# Patient Record
Sex: Female | Born: 1965 | Race: Black or African American | Hispanic: No | Marital: Married | Smoking: Never smoker
Health system: Southern US, Community
[De-identification: ages and names within clinical notes are randomized; demographics above are authoritative.]

## PROBLEM LIST (undated history)

## (undated) DIAGNOSIS — I10 Essential (primary) hypertension: Secondary | ICD-10-CM

## (undated) DIAGNOSIS — T7840XA Allergy, unspecified, initial encounter: Secondary | ICD-10-CM

## (undated) HISTORY — DX: Allergy, unspecified, initial encounter: T78.40XA

## (undated) HISTORY — DX: Essential (primary) hypertension: I10

---

## 1998-08-20 ENCOUNTER — Other Ambulatory Visit: Admission: RE | Admit: 1998-08-20 | Discharge: 1998-08-20 | Payer: Self-pay | Admitting: *Deleted

## 1999-06-27 ENCOUNTER — Encounter: Payer: Self-pay | Admitting: Obstetrics and Gynecology

## 1999-06-27 ENCOUNTER — Encounter: Admission: RE | Admit: 1999-06-27 | Discharge: 1999-06-27 | Payer: Self-pay | Admitting: Obstetrics and Gynecology

## 1999-09-21 ENCOUNTER — Other Ambulatory Visit: Admission: RE | Admit: 1999-09-21 | Discharge: 1999-09-21 | Payer: Self-pay | Admitting: Obstetrics and Gynecology

## 1999-11-07 ENCOUNTER — Encounter (INDEPENDENT_AMBULATORY_CARE_PROVIDER_SITE_OTHER): Payer: Self-pay

## 1999-11-07 ENCOUNTER — Other Ambulatory Visit: Admission: RE | Admit: 1999-11-07 | Discharge: 1999-11-07 | Payer: Self-pay | Admitting: Obstetrics and Gynecology

## 2000-04-02 ENCOUNTER — Other Ambulatory Visit: Admission: RE | Admit: 2000-04-02 | Discharge: 2000-04-02 | Payer: Self-pay | Admitting: Obstetrics and Gynecology

## 2000-07-25 ENCOUNTER — Other Ambulatory Visit: Admission: RE | Admit: 2000-07-25 | Discharge: 2000-07-25 | Payer: Self-pay | Admitting: Obstetrics and Gynecology

## 2000-11-27 ENCOUNTER — Other Ambulatory Visit: Admission: RE | Admit: 2000-11-27 | Discharge: 2000-11-27 | Payer: Self-pay | Admitting: Obstetrics and Gynecology

## 2001-11-29 ENCOUNTER — Other Ambulatory Visit: Admission: RE | Admit: 2001-11-29 | Discharge: 2001-11-29 | Payer: Self-pay | Admitting: Obstetrics and Gynecology

## 2002-07-24 ENCOUNTER — Encounter: Admission: RE | Admit: 2002-07-24 | Discharge: 2002-10-22 | Payer: Self-pay | Admitting: Obstetrics and Gynecology

## 2002-11-25 ENCOUNTER — Encounter: Admission: RE | Admit: 2002-11-25 | Discharge: 2003-02-23 | Payer: Self-pay | Admitting: Obstetrics and Gynecology

## 2002-12-25 ENCOUNTER — Other Ambulatory Visit: Admission: RE | Admit: 2002-12-25 | Discharge: 2002-12-25 | Payer: Self-pay | Admitting: Obstetrics and Gynecology

## 2004-04-22 ENCOUNTER — Encounter: Admission: RE | Admit: 2004-04-22 | Discharge: 2004-04-22 | Payer: Self-pay | Admitting: Obstetrics and Gynecology

## 2005-04-26 ENCOUNTER — Encounter: Admission: RE | Admit: 2005-04-26 | Discharge: 2005-04-26 | Payer: Self-pay | Admitting: Obstetrics and Gynecology

## 2006-04-27 ENCOUNTER — Encounter: Admission: RE | Admit: 2006-04-27 | Discharge: 2006-04-27 | Payer: Self-pay | Admitting: Obstetrics and Gynecology

## 2007-04-30 ENCOUNTER — Encounter: Admission: RE | Admit: 2007-04-30 | Discharge: 2007-04-30 | Payer: Self-pay | Admitting: Obstetrics and Gynecology

## 2007-10-03 ENCOUNTER — Ambulatory Visit (HOSPITAL_COMMUNITY): Admission: RE | Admit: 2007-10-03 | Discharge: 2007-10-03 | Payer: Self-pay | Admitting: Obstetrics and Gynecology

## 2008-05-06 ENCOUNTER — Encounter: Admission: RE | Admit: 2008-05-06 | Discharge: 2008-05-06 | Payer: Self-pay | Admitting: Obstetrics and Gynecology

## 2009-01-26 ENCOUNTER — Encounter: Admission: RE | Admit: 2009-01-26 | Discharge: 2009-01-26 | Payer: Self-pay | Admitting: Family Medicine

## 2009-05-07 ENCOUNTER — Encounter: Admission: RE | Admit: 2009-05-07 | Discharge: 2009-05-07 | Payer: Self-pay | Admitting: Obstetrics and Gynecology

## 2010-05-16 ENCOUNTER — Encounter
Admission: RE | Admit: 2010-05-16 | Discharge: 2010-05-16 | Payer: Self-pay | Source: Home / Self Care | Attending: Obstetrics and Gynecology | Admitting: Obstetrics and Gynecology

## 2010-09-06 NOTE — Op Note (Signed)
NAME:  Sierra Hudson, Sierra Hudson                ACCOUNT NO.:  [id number]   MEDICAL RECORD NO.:  [id number]          PATIENT TYPE:  AMB   LOCATION:  [facility]                           FACILITY:  [facility]   PHYSICIAN:  Maxie Better, M.D.DATE OF BIRTH:  Jan 24, 1966   DATE OF PROCEDURE:  DATE OF DISCHARGE:                               OPERATIVE REPORT   PREOPERATIVE DIAGNOSIS:  Menorrhagia.   POSTOPERATIVE DIAGNOSIS:  Menorrhagia.   PROCEDURE:  NovaSure ablation with  diagnostic hysteroscopy.   ANESTHESIA:  General paracervical block.   SURGEON:  Maxie Better, MD   PROCEDURE:  Under adequate general anesthesia, the patient was placed in  the dorsal lithotomy position.  She was sterilely prepped and draped in  the usual fashion.  Bladder was catheterized for moderate amount of  urine.  Examination under anesthesia revealed an anteverted, slightly  large uterus.  No adnexal masses could be appreciated.  Bivalved  speculum placed in the vagina.  Single-tooth tenaculum placed on the  anterior lip of the cervix.  A 10 mL of 1% Nesacaine was injected  paracervically at 3 and the 9 o'clock positions.  The cervix easily  accepted a #23 Pratt dilator.  A diagnostic hysteroscope was introduced  into the uterine cavity.  Both tubal ostia could be seen, but they were  sclerosed.  No endometrial masses were noted and no cervical mass was  noted.  The hysteroscope was removed.  The uterus was sounded to 10 cm.  The endocervical canal sounded to 5 cm.  A 5-cm depth was therefore  noted for cavity length.  The cavity width after the insertion of the  NovaSure apparatus was 3.4 and reconfirmed at 3.4 with a power of 94.  Two minutes of ablation was performed, and subsequently the NovaSure  apparatus was removed.  Diagnostic hysteroscope was reinserted.  Good  ablation was then noted throughout.  The procedure was then felt to be  complete, at which time all instruments were then removed from the  vagina.   Specimen was none.  Estimated blood loss was minimal.  Complication was none.  The patient tolerated the procedure well and was  transferred to recovery room in stable condition.      Maxie Better, M.D.  Electronically Signed     [place]/[facility]  D:  10/03/2007  T:  10/04/2007  Job:  [id number]

## 2011-01-19 LAB — CBC
HCT: 31.9 — ABNORMAL LOW
Hemoglobin: 10.8 — ABNORMAL LOW
MCV: 87.8
Platelets: 361
RDW: 16.1 — ABNORMAL HIGH

## 2011-02-20 ENCOUNTER — Other Ambulatory Visit: Payer: Self-pay | Admitting: Obstetrics and Gynecology

## 2011-02-20 DIAGNOSIS — Z1231 Encounter for screening mammogram for malignant neoplasm of breast: Secondary | ICD-10-CM

## 2011-05-19 ENCOUNTER — Ambulatory Visit
Admission: RE | Admit: 2011-05-19 | Discharge: 2011-05-19 | Disposition: A | Discharge status: Home / Self Care | Payer: 59 | Source: Ambulatory Visit | Attending: Obstetrics and Gynecology | Admitting: Obstetrics and Gynecology

## 2011-05-19 DIAGNOSIS — Z1231 Encounter for screening mammogram for malignant neoplasm of breast: Secondary | ICD-10-CM

## 2012-02-19 ENCOUNTER — Other Ambulatory Visit: Payer: Self-pay | Admitting: Obstetrics and Gynecology

## 2012-02-19 DIAGNOSIS — Z1231 Encounter for screening mammogram for malignant neoplasm of breast: Secondary | ICD-10-CM

## 2012-05-20 ENCOUNTER — Ambulatory Visit
Admission: RE | Admit: 2012-05-20 | Discharge: 2012-05-20 | Disposition: A | Discharge status: Home / Self Care | Payer: 59 | Source: Ambulatory Visit | Attending: Obstetrics and Gynecology | Admitting: Obstetrics and Gynecology

## 2012-05-20 DIAGNOSIS — Z1231 Encounter for screening mammogram for malignant neoplasm of breast: Secondary | ICD-10-CM

## 2013-03-17 ENCOUNTER — Other Ambulatory Visit: Payer: Self-pay

## 2013-03-17 DIAGNOSIS — Z1231 Encounter for screening mammogram for malignant neoplasm of breast: Secondary | ICD-10-CM

## 2013-05-22 ENCOUNTER — Other Ambulatory Visit: Payer: Self-pay

## 2013-05-22 ENCOUNTER — Ambulatory Visit
Admission: RE | Admit: 2013-05-22 | Discharge: 2013-05-22 | Disposition: A | Discharge status: Home / Self Care | Payer: Self-pay | Source: Ambulatory Visit

## 2013-05-22 DIAGNOSIS — Z1231 Encounter for screening mammogram for malignant neoplasm of breast: Secondary | ICD-10-CM

## 2013-06-17 ENCOUNTER — Ambulatory Visit (INDEPENDENT_AMBULATORY_CARE_PROVIDER_SITE_OTHER): Payer: 59 | Admitting: Emergency Medicine

## 2013-06-17 VITALS — BP 142/82 | HR 94 | Temp 98.2°F | Resp 18 | Ht 61.5 in | Wt 145.0 lb

## 2013-06-17 DIAGNOSIS — R42 Dizziness and giddiness: Secondary | ICD-10-CM

## 2013-06-17 DIAGNOSIS — R0981 Nasal congestion: Secondary | ICD-10-CM

## 2013-06-17 DIAGNOSIS — J3489 Other specified disorders of nose and nasal sinuses: Secondary | ICD-10-CM

## 2013-06-17 MED ORDER — MECLIZINE HCL 50 MG PO TABS: 50.0000 mg | ORAL_TABLET | Freq: Three times a day (TID) | ORAL | Status: AC | PRN

## 2013-06-17 MED ORDER — OXYMETAZOLINE HCL 0.05 % NA SOLN
1.0000 | Freq: Two times a day (BID) | NASAL | Status: AC
Start: 2013-06-17 — End: ?

## 2013-06-17 NOTE — Progress Notes (Signed)
   Subjective:    Patient ID: Sierra Hudson, female    DOB: 16-Sep-1965, 48 y.o.   MRN: [id number]  HPI 48 yo female with bilateral ear pressure secondary to worsening congestion.  No fever or chills.  Patient with history of worsening symptoms secondary to allergies and takes steroid nasal spray daily in addition to antiallergy medications.  Currently feels that her symptoms are worsening secondary to weather change.  No cough.  No sore throat.  No other recent illness.  Reports one episode of dizziness secondary to congested ears.  She has had this with prior similar issues.  PPMH:  Allergic rhinitis, hypertension  SH:  Non smoker, no alcohol.   Review of Systems  Constitutional: Negative for fever and chills.  HENT: Positive for congestion, ear pain and sinus pressure. Negative for ear discharge and sore throat.   Eyes: Negative for pain.  Respiratory: Negative for cough, chest tightness and wheezing.   Cardiovascular: Negative for chest pain.  Gastrointestinal: Negative for nausea, vomiting and abdominal pain.  Musculoskeletal: Negative for neck pain.  Neurological: Positive for dizziness. Negative for weakness and light-headedness.       Objective:   Physical Exam Blood pressure 142/82, pulse 94, temperature 98.2 F (36.8 C), temperature source Oral, resp. rate 18, height 5' 1.5" (1.562 m), weight 145 lb (65.772 kg), last menstrual period 05/03/2013, SpO2 97.00%. Body mass index is 26.96 kg/(m^2). Well-developed, well nourished femalewho is awake, alert and oriented, in NAD. HEENT: [place]/AT, PERRL, EOMI.  Sclera and conjunctiva are clear.  EAC are patent, TMs are buldging with no erythema.  No drainage in external canals.  Nasal mucosa edematous.. OP is clear. Neck: supple, non-tender, no lymphadenopathy, thyromegaly. Heart: RRR, no murmur Lungs: normal effort, CTA Abdomen: normo-active bowel sounds, supple, non-tender, no mass or organomegaly. Extremities: no cyanosis, clubbing  or edema. Skin: warm and dry without rash. Psychologic: good mood and appropriate affect, normal speech and behavior.        Assessment & Plan:  Sinus congestion with bilateral ear congestion and secondary vertigo.  Given rx for afrin and antivert.  Advised to use caution with excessive afrin usage and to take meclizine as needed.

## 2013-06-17 NOTE — Patient Instructions (Signed)
Take the medications as directed.  Follow up with your regular doctor in one week.

## 2014-03-27 ENCOUNTER — Other Ambulatory Visit: Payer: Self-pay

## 2014-03-27 DIAGNOSIS — Z1231 Encounter for screening mammogram for malignant neoplasm of breast: Secondary | ICD-10-CM

## 2014-05-25 ENCOUNTER — Ambulatory Visit
Admission: RE | Admit: 2014-05-25 | Discharge: 2014-05-25 | Disposition: A | Discharge status: Home / Self Care | Payer: 59 | Source: Ambulatory Visit

## 2014-05-25 DIAGNOSIS — Z1231 Encounter for screening mammogram for malignant neoplasm of breast: Secondary | ICD-10-CM

## 2015-04-22 ENCOUNTER — Other Ambulatory Visit: Payer: Self-pay

## 2015-04-22 DIAGNOSIS — Z1231 Encounter for screening mammogram for malignant neoplasm of breast: Secondary | ICD-10-CM

## 2015-05-27 ENCOUNTER — Ambulatory Visit
Admission: RE | Admit: 2015-05-27 | Discharge: 2015-05-27 | Disposition: A | Discharge status: Home / Self Care | Payer: 59 | Source: Ambulatory Visit

## 2015-05-27 DIAGNOSIS — Z1231 Encounter for screening mammogram for malignant neoplasm of breast: Secondary | ICD-10-CM

## 2016-04-19 ENCOUNTER — Other Ambulatory Visit: Payer: Self-pay | Admitting: Obstetrics and Gynecology

## 2016-04-19 DIAGNOSIS — Z1231 Encounter for screening mammogram for malignant neoplasm of breast: Secondary | ICD-10-CM

## 2016-05-30 ENCOUNTER — Ambulatory Visit
Admission: RE | Admit: 2016-05-30 | Discharge: 2016-05-30 | Disposition: A | Discharge status: Home / Self Care | Payer: 59 | Source: Ambulatory Visit | Attending: Obstetrics and Gynecology | Admitting: Obstetrics and Gynecology

## 2016-05-30 DIAGNOSIS — Z1231 Encounter for screening mammogram for malignant neoplasm of breast: Secondary | ICD-10-CM | POA: Diagnosis not present

## 2016-08-01 DIAGNOSIS — Z01419 Encounter for gynecological examination (general) (routine) without abnormal findings: Secondary | ICD-10-CM | POA: Diagnosis not present

## 2016-08-28 DIAGNOSIS — I10 Essential (primary) hypertension: Secondary | ICD-10-CM | POA: Diagnosis not present

## 2016-08-28 DIAGNOSIS — E7800 Pure hypercholesterolemia, unspecified: Secondary | ICD-10-CM | POA: Diagnosis not present

## 2016-08-28 DIAGNOSIS — J309 Allergic rhinitis, unspecified: Secondary | ICD-10-CM | POA: Diagnosis not present

## 2017-03-02 DIAGNOSIS — I10 Essential (primary) hypertension: Secondary | ICD-10-CM | POA: Diagnosis not present

## 2017-03-02 DIAGNOSIS — J309 Allergic rhinitis, unspecified: Secondary | ICD-10-CM | POA: Diagnosis not present

## 2017-03-02 DIAGNOSIS — E7800 Pure hypercholesterolemia, unspecified: Secondary | ICD-10-CM | POA: Diagnosis not present

## 2017-04-10 DIAGNOSIS — J069 Acute upper respiratory infection, unspecified: Secondary | ICD-10-CM | POA: Diagnosis not present

## 2017-04-23 ENCOUNTER — Other Ambulatory Visit: Payer: Self-pay | Admitting: Obstetrics and Gynecology

## 2017-04-23 DIAGNOSIS — Z1231 Encounter for screening mammogram for malignant neoplasm of breast: Secondary | ICD-10-CM

## 2017-05-31 ENCOUNTER — Ambulatory Visit
Admission: RE | Admit: 2017-05-31 | Discharge: 2017-05-31 | Disposition: A | Discharge status: Home / Self Care | Payer: 59 | Source: Ambulatory Visit | Attending: Obstetrics and Gynecology | Admitting: Obstetrics and Gynecology

## 2017-05-31 DIAGNOSIS — Z1231 Encounter for screening mammogram for malignant neoplasm of breast: Secondary | ICD-10-CM | POA: Diagnosis not present

## 2017-08-08 DIAGNOSIS — Z01419 Encounter for gynecological examination (general) (routine) without abnormal findings: Secondary | ICD-10-CM | POA: Diagnosis not present

## 2017-08-30 DIAGNOSIS — J309 Allergic rhinitis, unspecified: Secondary | ICD-10-CM | POA: Diagnosis not present

## 2017-08-30 DIAGNOSIS — E7800 Pure hypercholesterolemia, unspecified: Secondary | ICD-10-CM | POA: Diagnosis not present

## 2017-08-30 DIAGNOSIS — I10 Essential (primary) hypertension: Secondary | ICD-10-CM | POA: Diagnosis not present

## 2017-11-23 DIAGNOSIS — M67911 Unspecified disorder of synovium and tendon, right shoulder: Secondary | ICD-10-CM | POA: Diagnosis not present

## 2017-11-26 DIAGNOSIS — S46011D Strain of muscle(s) and tendon(s) of the rotator cuff of right shoulder, subsequent encounter: Secondary | ICD-10-CM | POA: Diagnosis not present

## 2017-11-29 DIAGNOSIS — S46011D Strain of muscle(s) and tendon(s) of the rotator cuff of right shoulder, subsequent encounter: Secondary | ICD-10-CM | POA: Diagnosis not present

## 2017-12-05 DIAGNOSIS — S46011D Strain of muscle(s) and tendon(s) of the rotator cuff of right shoulder, subsequent encounter: Secondary | ICD-10-CM | POA: Diagnosis not present

## 2017-12-10 DIAGNOSIS — S46011D Strain of muscle(s) and tendon(s) of the rotator cuff of right shoulder, subsequent encounter: Secondary | ICD-10-CM | POA: Diagnosis not present

## 2017-12-13 DIAGNOSIS — S46011D Strain of muscle(s) and tendon(s) of the rotator cuff of right shoulder, subsequent encounter: Secondary | ICD-10-CM | POA: Diagnosis not present

## 2017-12-19 DIAGNOSIS — S46011D Strain of muscle(s) and tendon(s) of the rotator cuff of right shoulder, subsequent encounter: Secondary | ICD-10-CM | POA: Diagnosis not present

## 2017-12-21 DIAGNOSIS — S46011D Strain of muscle(s) and tendon(s) of the rotator cuff of right shoulder, subsequent encounter: Secondary | ICD-10-CM | POA: Diagnosis not present

## 2017-12-21 DIAGNOSIS — M7501 Adhesive capsulitis of right shoulder: Secondary | ICD-10-CM | POA: Diagnosis not present

## 2017-12-31 DIAGNOSIS — S46011D Strain of muscle(s) and tendon(s) of the rotator cuff of right shoulder, subsequent encounter: Secondary | ICD-10-CM | POA: Diagnosis not present

## 2018-01-03 DIAGNOSIS — S46011D Strain of muscle(s) and tendon(s) of the rotator cuff of right shoulder, subsequent encounter: Secondary | ICD-10-CM | POA: Diagnosis not present

## 2018-01-08 DIAGNOSIS — S46011D Strain of muscle(s) and tendon(s) of the rotator cuff of right shoulder, subsequent encounter: Secondary | ICD-10-CM | POA: Diagnosis not present

## 2018-01-10 DIAGNOSIS — S46011D Strain of muscle(s) and tendon(s) of the rotator cuff of right shoulder, subsequent encounter: Secondary | ICD-10-CM | POA: Diagnosis not present

## 2018-01-15 DIAGNOSIS — S46011D Strain of muscle(s) and tendon(s) of the rotator cuff of right shoulder, subsequent encounter: Secondary | ICD-10-CM | POA: Diagnosis not present

## 2018-01-17 DIAGNOSIS — S46011D Strain of muscle(s) and tendon(s) of the rotator cuff of right shoulder, subsequent encounter: Secondary | ICD-10-CM | POA: Diagnosis not present

## 2018-01-22 DIAGNOSIS — S46011D Strain of muscle(s) and tendon(s) of the rotator cuff of right shoulder, subsequent encounter: Secondary | ICD-10-CM | POA: Diagnosis not present

## 2018-01-25 DIAGNOSIS — S46011D Strain of muscle(s) and tendon(s) of the rotator cuff of right shoulder, subsequent encounter: Secondary | ICD-10-CM | POA: Diagnosis not present

## 2018-01-29 DIAGNOSIS — S46011D Strain of muscle(s) and tendon(s) of the rotator cuff of right shoulder, subsequent encounter: Secondary | ICD-10-CM | POA: Diagnosis not present

## 2018-01-31 DIAGNOSIS — S46011D Strain of muscle(s) and tendon(s) of the rotator cuff of right shoulder, subsequent encounter: Secondary | ICD-10-CM | POA: Diagnosis not present

## 2018-02-04 DIAGNOSIS — S46011D Strain of muscle(s) and tendon(s) of the rotator cuff of right shoulder, subsequent encounter: Secondary | ICD-10-CM | POA: Diagnosis not present

## 2018-02-06 DIAGNOSIS — M7501 Adhesive capsulitis of right shoulder: Secondary | ICD-10-CM | POA: Diagnosis not present

## 2018-03-12 DIAGNOSIS — I10 Essential (primary) hypertension: Secondary | ICD-10-CM | POA: Diagnosis not present

## 2018-03-12 DIAGNOSIS — J309 Allergic rhinitis, unspecified: Secondary | ICD-10-CM | POA: Diagnosis not present

## 2018-03-12 DIAGNOSIS — E7800 Pure hypercholesterolemia, unspecified: Secondary | ICD-10-CM | POA: Diagnosis not present

## 2018-04-22 ENCOUNTER — Other Ambulatory Visit: Payer: Self-pay | Admitting: Obstetrics and Gynecology

## 2018-04-22 DIAGNOSIS — Z1231 Encounter for screening mammogram for malignant neoplasm of breast: Secondary | ICD-10-CM

## 2018-06-03 ENCOUNTER — Encounter: Payer: Self-pay | Admitting: Radiology

## 2018-06-03 ENCOUNTER — Ambulatory Visit
Admission: RE | Admit: 2018-06-03 | Discharge: 2018-06-03 | Disposition: A | Discharge status: Home / Self Care | Payer: 59 | Source: Ambulatory Visit | Attending: Obstetrics and Gynecology | Admitting: Obstetrics and Gynecology

## 2018-06-03 DIAGNOSIS — Z1231 Encounter for screening mammogram for malignant neoplasm of breast: Secondary | ICD-10-CM | POA: Diagnosis not present

## 2018-08-20 DIAGNOSIS — Z6828 Body mass index (BMI) 28.0-28.9, adult: Secondary | ICD-10-CM | POA: Diagnosis not present

## 2018-08-20 DIAGNOSIS — Z01419 Encounter for gynecological examination (general) (routine) without abnormal findings: Secondary | ICD-10-CM | POA: Diagnosis not present

## 2018-09-10 DIAGNOSIS — E7800 Pure hypercholesterolemia, unspecified: Secondary | ICD-10-CM | POA: Diagnosis not present

## 2018-09-10 DIAGNOSIS — J309 Allergic rhinitis, unspecified: Secondary | ICD-10-CM | POA: Diagnosis not present

## 2018-09-10 DIAGNOSIS — I10 Essential (primary) hypertension: Secondary | ICD-10-CM | POA: Diagnosis not present

## 2019-04-09 ENCOUNTER — Other Ambulatory Visit: Payer: Self-pay | Admitting: Obstetrics and Gynecology

## 2019-04-09 DIAGNOSIS — Z1231 Encounter for screening mammogram for malignant neoplasm of breast: Secondary | ICD-10-CM

## 2019-06-05 ENCOUNTER — Ambulatory Visit
Admission: RE | Admit: 2019-06-05 | Discharge: 2019-06-05 | Disposition: A | Discharge status: Home / Self Care | Payer: 59 | Source: Ambulatory Visit | Attending: Obstetrics and Gynecology | Admitting: Obstetrics and Gynecology

## 2019-06-05 ENCOUNTER — Other Ambulatory Visit: Payer: Self-pay

## 2019-06-05 DIAGNOSIS — Z1231 Encounter for screening mammogram for malignant neoplasm of breast: Secondary | ICD-10-CM

## 2020-04-05 ENCOUNTER — Other Ambulatory Visit: Payer: Self-pay | Admitting: Obstetrics and Gynecology

## 2020-04-05 DIAGNOSIS — Z1231 Encounter for screening mammogram for malignant neoplasm of breast: Secondary | ICD-10-CM

## 2020-06-07 ENCOUNTER — Other Ambulatory Visit: Payer: Self-pay

## 2020-06-07 ENCOUNTER — Ambulatory Visit
Admission: RE | Admit: 2020-06-07 | Discharge: 2020-06-07 | Disposition: A | Discharge status: Home / Self Care | Payer: 59 | Source: Ambulatory Visit | Attending: Obstetrics and Gynecology | Admitting: Obstetrics and Gynecology

## 2020-06-07 DIAGNOSIS — Z1231 Encounter for screening mammogram for malignant neoplasm of breast: Secondary | ICD-10-CM

## 2021-04-26 ENCOUNTER — Other Ambulatory Visit: Payer: Self-pay | Admitting: Obstetrics and Gynecology

## 2021-04-26 DIAGNOSIS — Z1231 Encounter for screening mammogram for malignant neoplasm of breast: Secondary | ICD-10-CM

## 2021-06-09 ENCOUNTER — Ambulatory Visit
Admission: RE | Admit: 2021-06-09 | Discharge: 2021-06-09 | Disposition: A | Discharge status: Home / Self Care | Payer: 59 | Source: Ambulatory Visit | Attending: Obstetrics and Gynecology | Admitting: Obstetrics and Gynecology

## 2021-06-09 DIAGNOSIS — Z1231 Encounter for screening mammogram for malignant neoplasm of breast: Secondary | ICD-10-CM

## 2022-04-20 ENCOUNTER — Other Ambulatory Visit: Payer: Self-pay | Admitting: Obstetrics and Gynecology

## 2022-04-20 DIAGNOSIS — Z1231 Encounter for screening mammogram for malignant neoplasm of breast: Secondary | ICD-10-CM

## 2022-06-13 ENCOUNTER — Ambulatory Visit
Admission: RE | Admit: 2022-06-13 | Discharge: 2022-06-13 | Disposition: A | Discharge status: Home / Self Care | Payer: 59 | Source: Ambulatory Visit | Attending: Obstetrics and Gynecology | Admitting: Obstetrics and Gynecology

## 2022-06-13 DIAGNOSIS — Z1231 Encounter for screening mammogram for malignant neoplasm of breast: Secondary | ICD-10-CM

## 2022-07-03 ENCOUNTER — Other Ambulatory Visit: Payer: Self-pay | Admitting: Family Medicine

## 2022-07-03 ENCOUNTER — Ambulatory Visit
Admission: RE | Admit: 2022-07-03 | Discharge: 2022-07-03 | Disposition: A | Discharge status: Home / Self Care | Payer: 59 | Source: Ambulatory Visit | Attending: Family Medicine | Admitting: Family Medicine

## 2022-07-03 DIAGNOSIS — M79672 Pain in left foot: Secondary | ICD-10-CM

## 2022-07-25 IMAGING — MG MM DIGITAL SCREENING BILAT W/ TOMO AND CAD
8 series · 8 of 24 positions shown · non-contrast
Comparison: Previous exam(s).

CLINICAL DATA: Screening.

EXAM:
DIGITAL SCREENING BILATERAL MAMMOGRAM WITH TOMOSYNTHESIS AND CAD
TECHNIQUE: Bilateral screening digital craniocaudal and mediolateral oblique
mammograms were obtained. Bilateral screening digital breast
tomosynthesis was performed. The images were evaluated with
computer-aided detection.

[L MLO synth-2D]
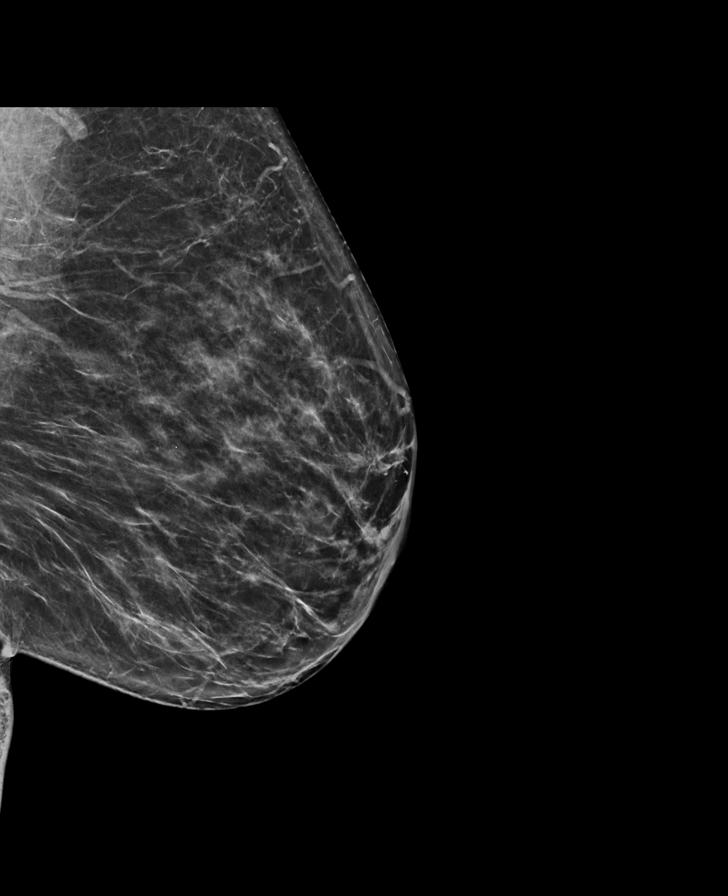

[R MLO synth-2D]
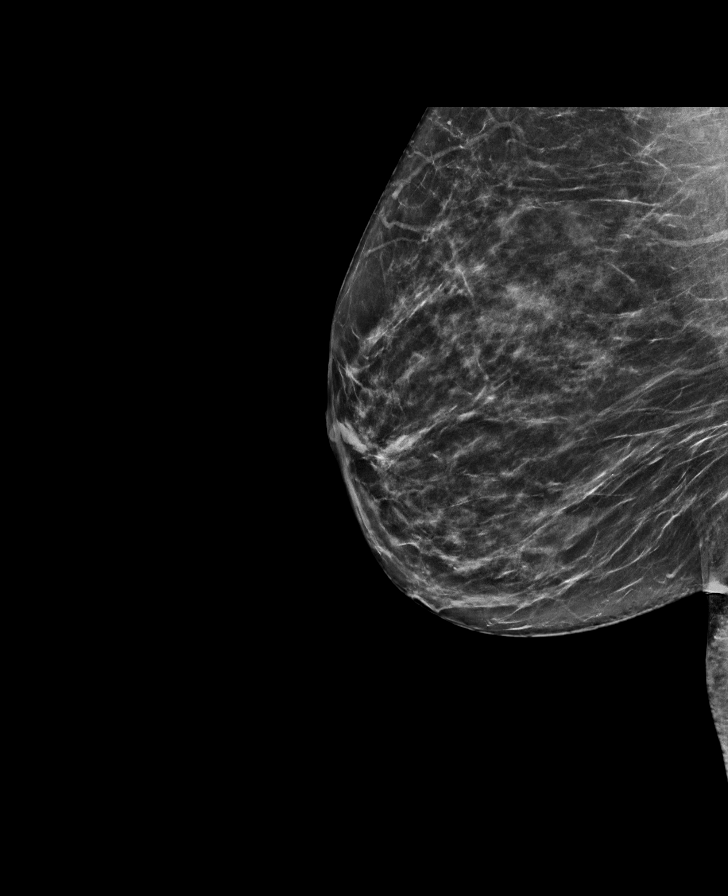

[L CC synth-2D]
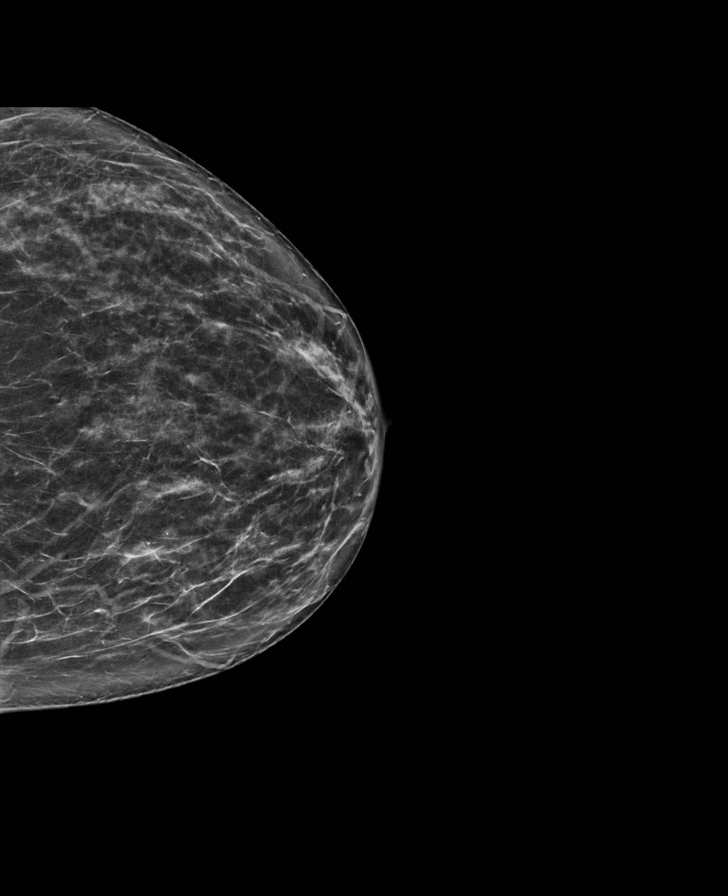

[R CC synth-2D]
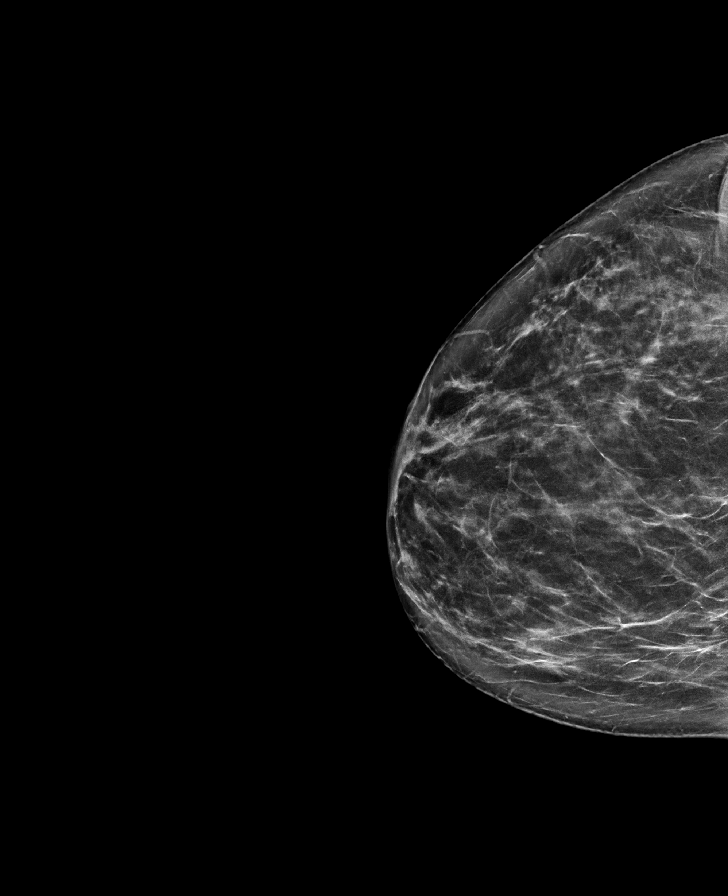

[R MLO tomo · tomo slice 36/71.0]
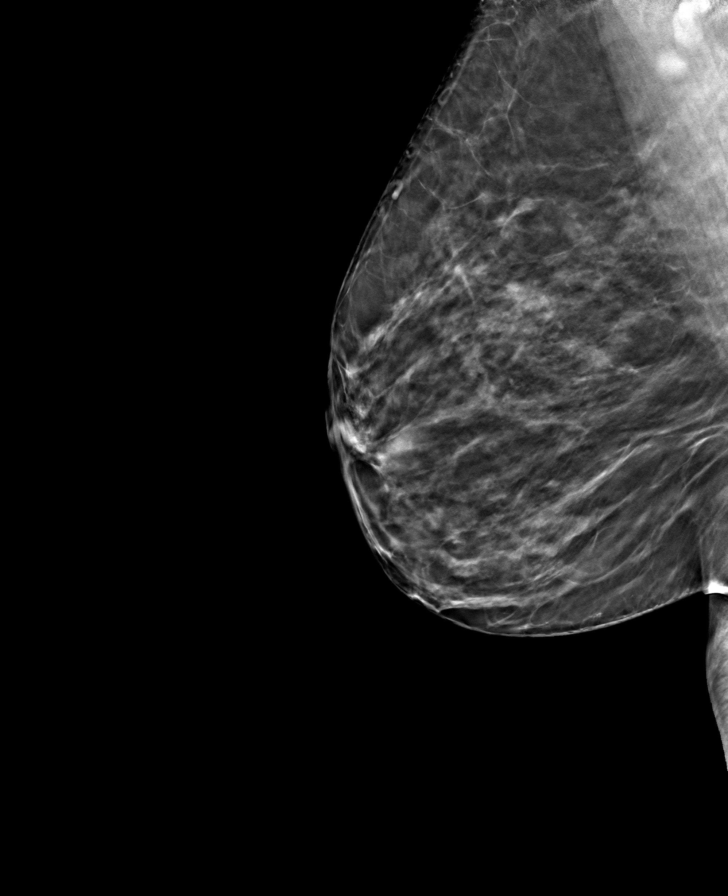

[L CC tomo · tomo slice 37/72.0]
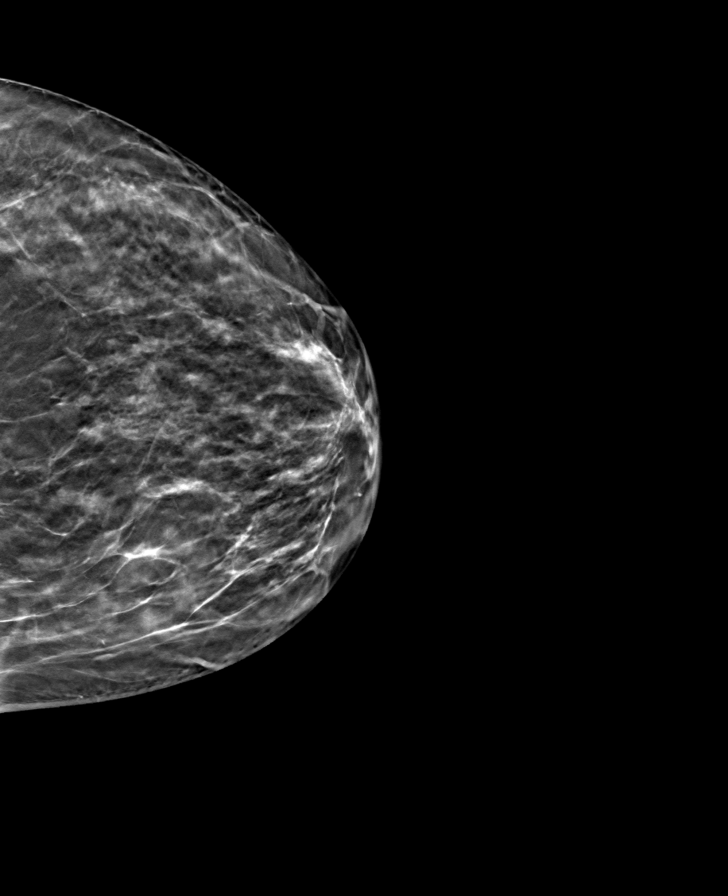

[L MLO tomo · tomo slice 37/73.0]
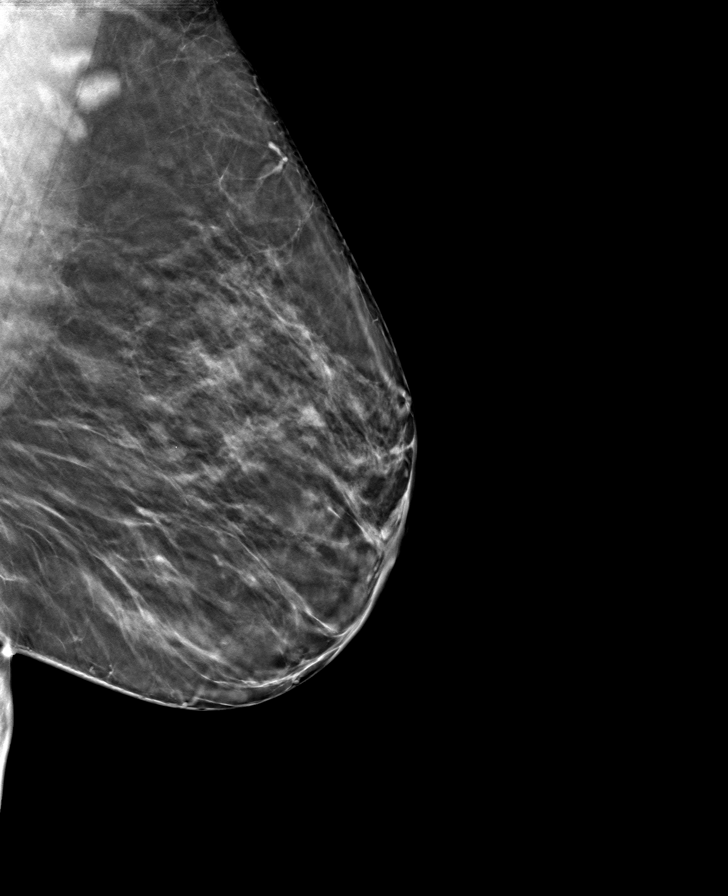

[R CC tomo · tomo slice 38/75.0]
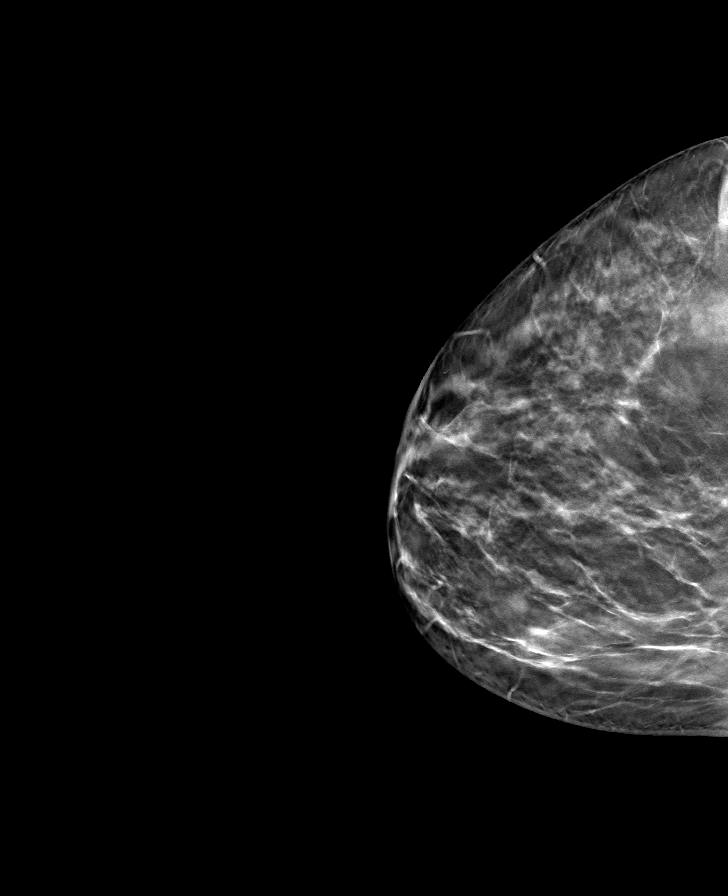

[8 of 24 positions shown; findings below may reference images not displayed]

ACR Breast Density Category c: The breast tissue is heterogeneously
dense, which may obscure small masses.
FINDINGS: There are no findings suspicious for malignancy.
IMPRESSION: No mammographic evidence of malignancy. A result letter of this
screening mammogram will be mailed directly to the patient.

RECOMMENDATION:
Screening mammogram in one year. (Code:Q3-W-BC3)

BI-RADS CATEGORY  1: Negative.

## 2023-04-26 ENCOUNTER — Other Ambulatory Visit: Payer: Self-pay | Admitting: Internal Medicine

## 2023-04-26 DIAGNOSIS — Z1231 Encounter for screening mammogram for malignant neoplasm of breast: Secondary | ICD-10-CM

## 2023-06-18 ENCOUNTER — Ambulatory Visit
Admission: RE | Admit: 2023-06-18 | Discharge: 2023-06-18 | Disposition: A | Discharge status: Home / Self Care | Payer: 59 | Source: Ambulatory Visit | Attending: Internal Medicine | Admitting: Internal Medicine

## 2023-06-18 DIAGNOSIS — Z1231 Encounter for screening mammogram for malignant neoplasm of breast: Secondary | ICD-10-CM

## 2024-04-21 ENCOUNTER — Other Ambulatory Visit: Payer: Self-pay | Admitting: Internal Medicine

## 2024-04-21 DIAGNOSIS — Z1231 Encounter for screening mammogram for malignant neoplasm of breast: Secondary | ICD-10-CM

## 2024-06-18 ENCOUNTER — Ambulatory Visit
